# Patient Record
Sex: Female | Born: 1965 | Hispanic: Yes | Marital: Single | Smoking: Never smoker
Health system: Southern US, Community
[De-identification: ages and names within clinical notes are randomized; demographics above are authoritative.]

## PROBLEM LIST (undated history)

## (undated) DIAGNOSIS — F329 Major depressive disorder, single episode, unspecified: Secondary | ICD-10-CM

## (undated) DIAGNOSIS — F32A Depression, unspecified: Secondary | ICD-10-CM

## (undated) HISTORY — DX: Major depressive disorder, single episode, unspecified: F32.9

## (undated) HISTORY — DX: Depression, unspecified: F32.A

## (undated) HISTORY — PX: TUBAL LIGATION: SHX77

---

## 2012-09-28 ENCOUNTER — Ambulatory Visit: Payer: Self-pay | Admitting: Family Medicine

## 2013-09-20 ENCOUNTER — Emergency Department: Payer: Self-pay | Admitting: Emergency Medicine

## 2015-01-31 IMAGING — CT CT HEAD WITHOUT CONTRAST
1 series · 16 of 30 positions shown, 20 images · non-contrast
Comparison: none

REASON FOR EXAM: 3 days of increasing HA with vertigo
COMMENTS:   LMP: Post-Menopausal

[Series 2: soft tissue · axial · 0.42mm/px · z∈[+332,+468]mm · 16 of 31 slices shown, 20 images]
[im 2/31  brain]
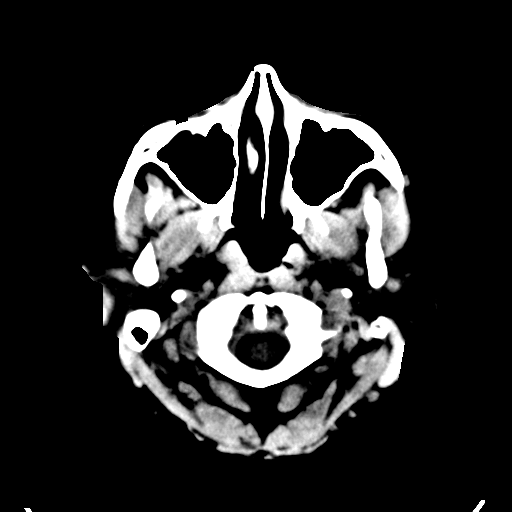
[im 2/31  bone]
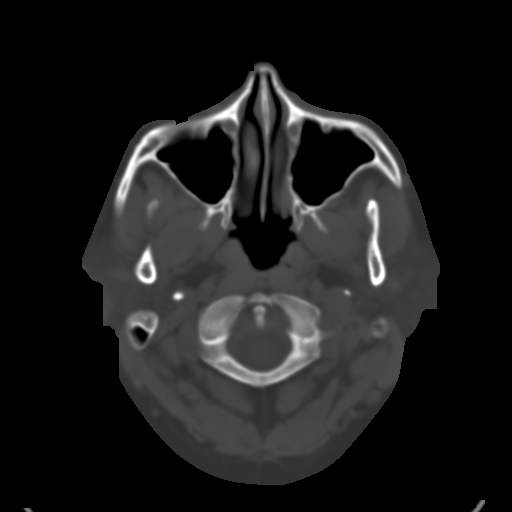
[im 4/31  brain]
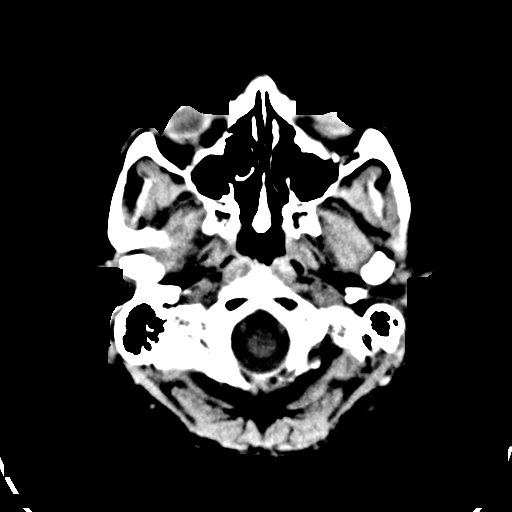
[im 6/31  brain]
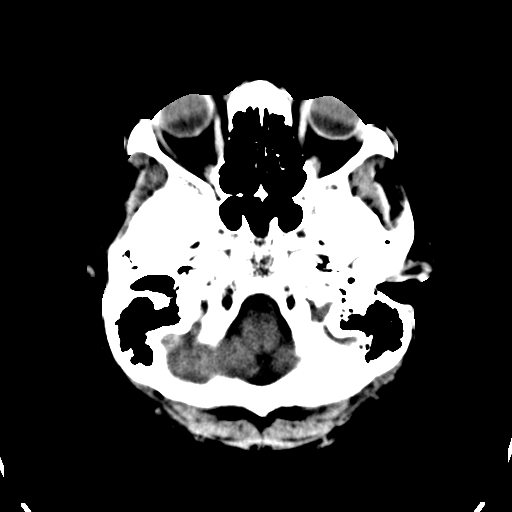
[im 8/31  brain]
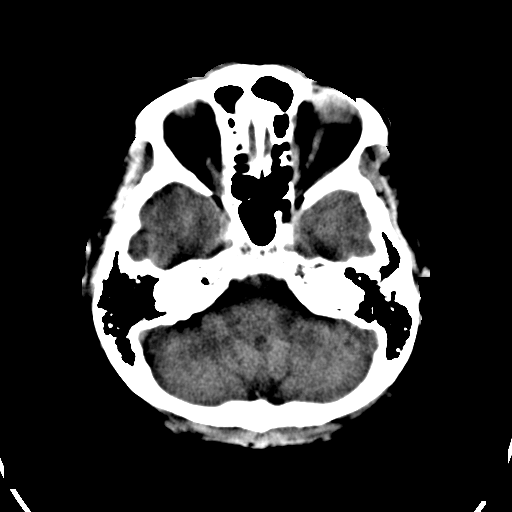
[im 9/31  brain]
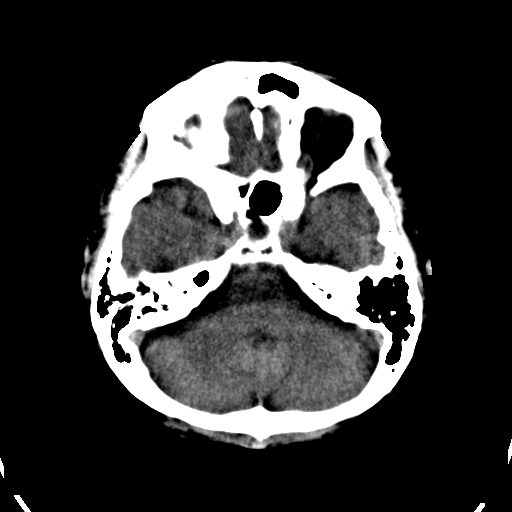
[im 9/31  bone]
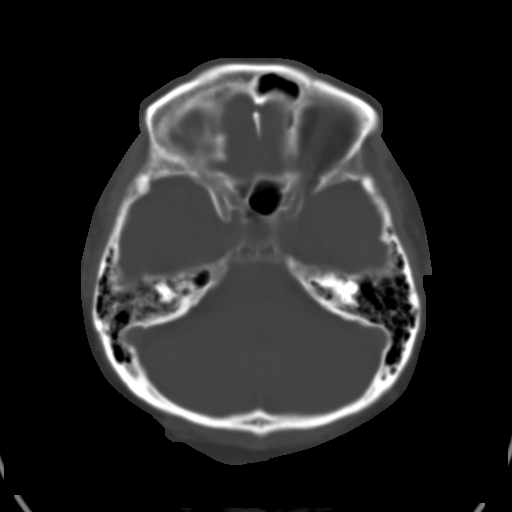
[im 11/31  brain]
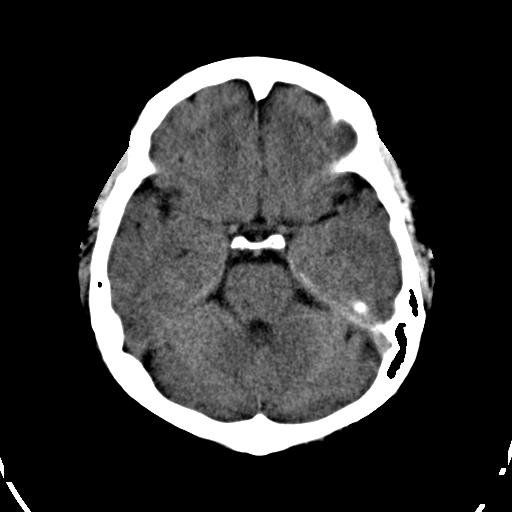
[im 13/31  brain]
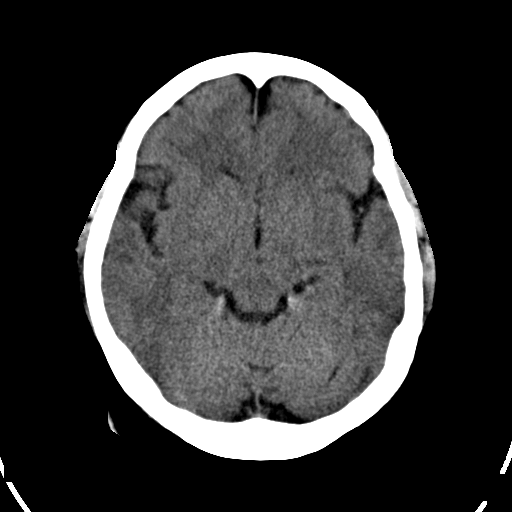
[im 15/31  brain]
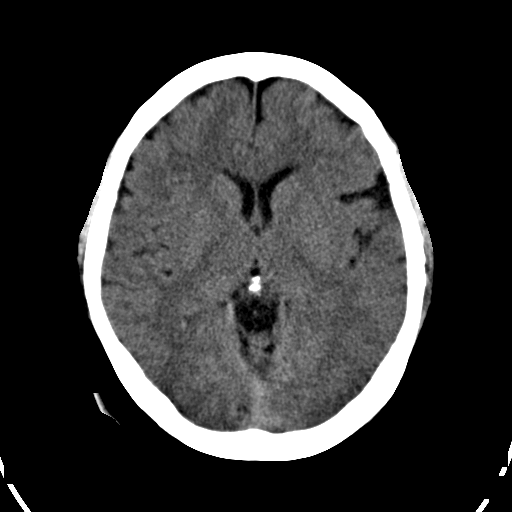
[im 16/31  brain]
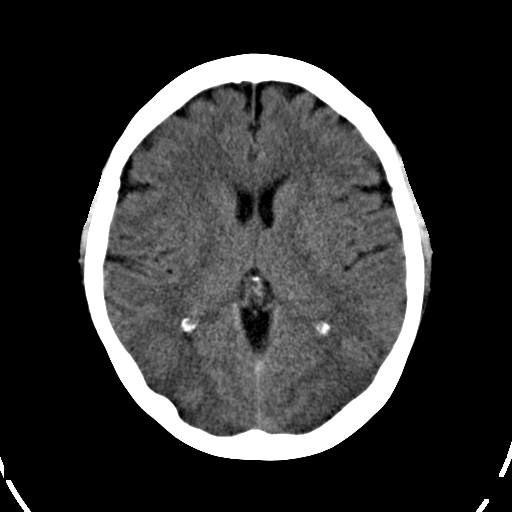
[im 16/31  bone]
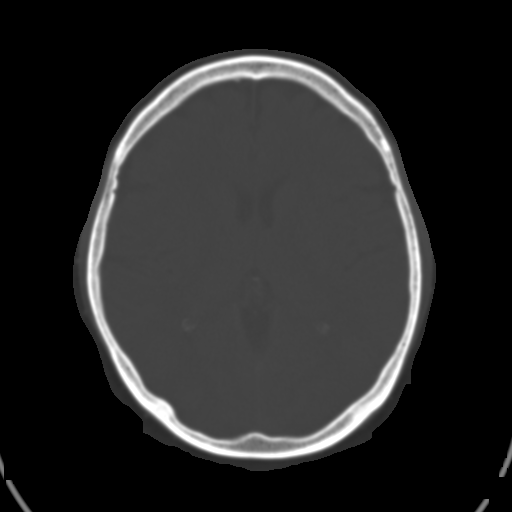
[im 18/31  brain]
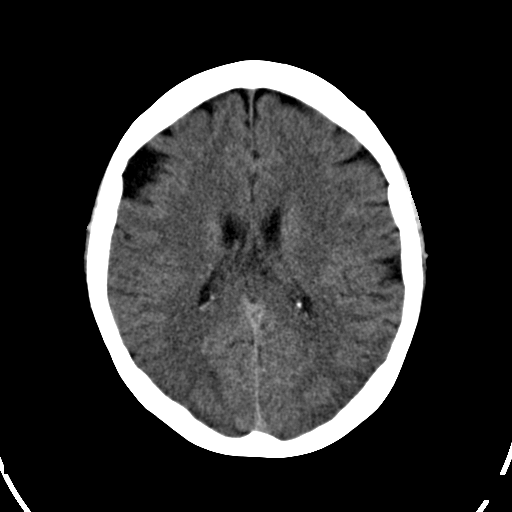
[im 20/31  brain]
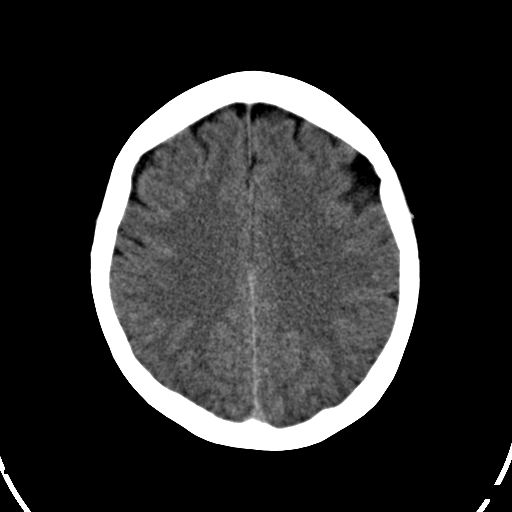
[im 22/31  brain]
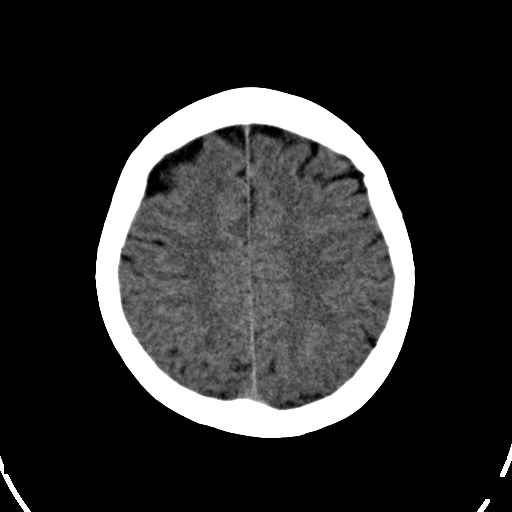
[im 23/31  brain]
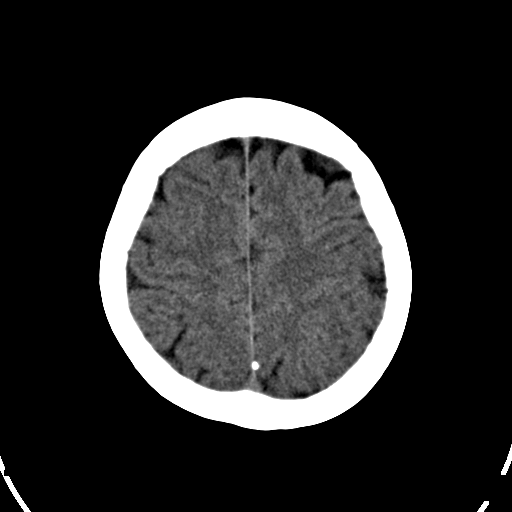
[im 23/31  bone]
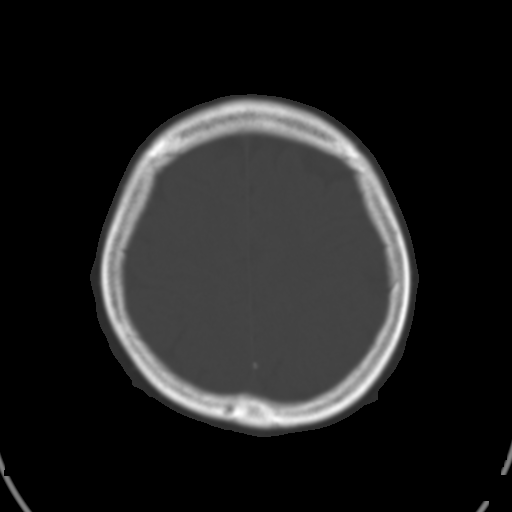
[im 25/31  brain]
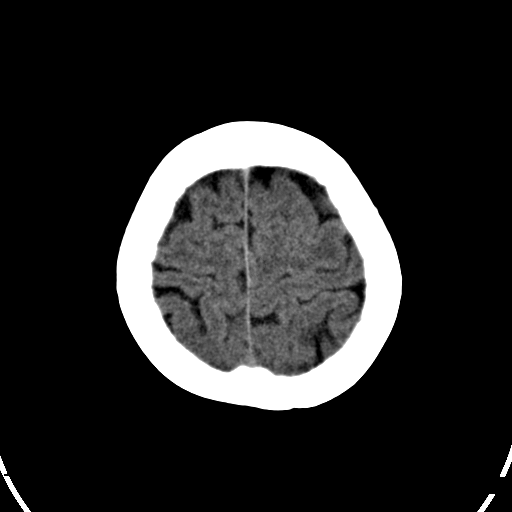
[im 27/31  brain]
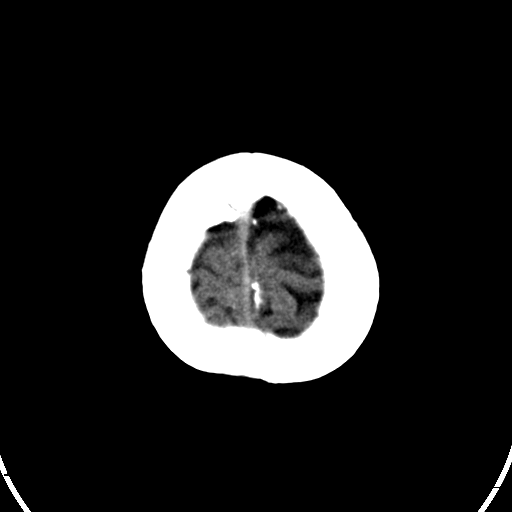
[im 29/31  brain]
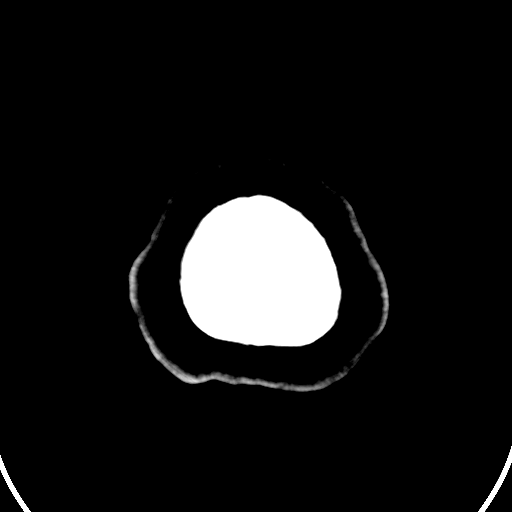

[16 of 30 positions shown; findings below may reference images not displayed]

PROCEDURE:     CT  - CT HEAD WITHOUT CONTRAST  - September 20, 2013  [DATE]

RESULT:     Axial noncontrast CT scanning was performed through the brain
with reconstructions at 5 mm intervals and slice thicknesses.

The ventricles are normal in size and position. There is no intracranial
hemorrhage nor intracranial mass effect. There is no evidence of an evolving
ischemic infarction. The cerebellum and brainstem are normal in density. At
bone window settings the observed portions of the paranasal sinuses and
mastoid air cells are clear. There is no evidence of an acute skull fracture.
IMPRESSION: 1. There is no evidence of an acute ischemic or hemorrhagic infarction.
2. There is no intracranial mass effect nor hydrocephalus. Specific
attention to the cerebellum and brainstem exhibits no acute abnormality.
3. There is no evidence of acute acute inflammation the visualized portions
of the paranasal sinuses or mastoid air cells.

[REDACTED]

## 2015-06-16 ENCOUNTER — Ambulatory Visit
Admission: RE | Admit: 2015-06-16 | Discharge: 2015-06-16 | Disposition: A | Discharge status: Home / Self Care | Payer: BLUE CROSS/BLUE SHIELD | Source: Ambulatory Visit | Attending: Gastroenterology | Admitting: Gastroenterology

## 2015-06-16 ENCOUNTER — Ambulatory Visit: Payer: BLUE CROSS/BLUE SHIELD | Admitting: Anesthesiology

## 2015-06-16 ENCOUNTER — Encounter
Admission: RE | Disposition: A | Discharge status: Home / Self Care | Payer: Self-pay | Source: Ambulatory Visit | Attending: Gastroenterology

## 2015-06-16 ENCOUNTER — Encounter: Payer: Self-pay | Admitting: *Deleted

## 2015-06-16 DIAGNOSIS — Z1211 Encounter for screening for malignant neoplasm of colon: Secondary | ICD-10-CM | POA: Insufficient documentation

## 2015-06-16 DIAGNOSIS — Z791 Long term (current) use of non-steroidal anti-inflammatories (NSAID): Secondary | ICD-10-CM | POA: Insufficient documentation

## 2015-06-16 DIAGNOSIS — Z79899 Other long term (current) drug therapy: Secondary | ICD-10-CM | POA: Insufficient documentation

## 2015-06-16 DIAGNOSIS — I252 Old myocardial infarction: Secondary | ICD-10-CM | POA: Diagnosis not present

## 2015-06-16 DIAGNOSIS — K648 Other hemorrhoids: Secondary | ICD-10-CM | POA: Insufficient documentation

## 2015-06-16 HISTORY — PX: COLONOSCOPY WITH PROPOFOL: SHX5780

## 2015-06-16 SURGERY — COLONOSCOPY WITH PROPOFOL
Anesthesia: General

## 2015-06-16 MED ORDER — PHENYLEPHRINE HCL 10 MG/ML IJ SOLN
INTRAMUSCULAR | Status: DC | PRN
  Administered 2015-06-16: 100 ug via INTRAVENOUS

## 2015-06-16 MED ORDER — SODIUM CHLORIDE 0.9 % IV SOLN
INTRAVENOUS | Status: DC
  Administered 2015-06-16: 15:00:00 via INTRAVENOUS

## 2015-06-16 MED ORDER — PROPOFOL INFUSION 10 MG/ML OPTIME
INTRAVENOUS | Status: DC | PRN
  Administered 2015-06-16: 180 ug/kg/min via INTRAVENOUS

## 2015-06-16 MED ORDER — LIDOCAINE HCL (CARDIAC) 20 MG/ML IV SOLN
INTRAVENOUS | Status: DC | PRN
  Administered 2015-06-16: 100 mg via INTRAVENOUS

## 2015-06-16 MED ORDER — MIDAZOLAM HCL 2 MG/2ML IJ SOLN
INTRAMUSCULAR | Status: DC | PRN
  Administered 2015-06-16: 1 mg via INTRAVENOUS

## 2015-06-16 MED ORDER — PROPOFOL 10 MG/ML IV BOLUS
INTRAVENOUS | Status: DC | PRN
  Administered 2015-06-16: 60 mg via INTRAVENOUS
  Administered 2015-06-16: 40 mg via INTRAVENOUS

## 2015-06-16 NOTE — Op Note (Signed)
[facility] Gastroenterology Patient Name: Diana HongSusana Gonzalez Cobos Procedure Date: 06/16/2015 3:54 PM MRN: [id number] Account #: [id number] Date of Birth: 23-Jun-1966 Admit Type: Outpatient Age: 89 [facility] Gender: Female Note Status: Finalized Procedure:         Colonoscopy Indications:       Screening for colorectal malignant neoplasm, This is the                     patient's first colonoscopy Providers:         Christena DeemMartin U. Skulskie, MD Medicines:         Monitored Anesthesia Care Complications:     No immediate complications. Procedure:         Pre-Anesthesia Assessment:                    - ASA Grade Assessment: I - A normal, healthy patient.                    After obtaining informed consent, the colonoscope was                     passed under direct vision. Throughout the procedure, the                     patient's blood pressure, pulse, and oxygen saturations                     were monitored continuously. The Colonoscope was                     introduced through the anus and advanced to the the cecum,                     identified by appendiceal orifice and ileocecal valve. The                     colonoscopy was performed without difficulty. The patient                     tolerated the procedure well. The quality of the bowel                     preparation was good. Findings:      Non-bleeding internal hemorrhoids were found during retroflexion. The       hemorrhoids were small.      The exam was otherwise without abnormality on direct and retroflexion       views. Impression:        - Non-bleeding internal hemorrhoids.                    - The examination was otherwise normal on direct and                     retroflexion views.                    - No specimens collected. Recommendation:    - Repeat colonoscopy in 10 years for screening purposes. Procedure Code(s): --- Professional ---                    [id number], Colonoscopy,  flexible; diagnostic, including                     collection of specimen(s)  by brushing or washing, when                     performed (separate procedure) Diagnosis Code(s): --- Professional ---                    V76.51, Special screening for malignant neoplasms of colon                    455.0, Internal hemorrhoids without mention of complication CPT copyright 2014 American Medical Association. All rights reserved. The codes documented in this report are preliminary and upon coder review may  be revised to meet current compliance requirements. Christena Deem, MD 06/16/2015 4:31:29 PM This report has been signed electronically. Number of Addenda: 0 Note Initiated On: 06/16/2015 3:54 PM Scope Withdrawal Time: 0 hours 9 minutes 14 seconds  Total Procedure Duration: 0 hours 17 minutes 7 seconds       [facility]

## 2015-06-16 NOTE — H&P (Signed)
Outpatient short stay form Pre-procedure 06/16/2015 3:12 PM Diana DeemMartin U Skulskie MD  Primary Physician: Dr Brigid ReSusan Gonzales-Cobos  Reason for visit:  Screening colonoscopy  History of present illness:  Patient is a 49 year old female sending for screening colonoscopy. She's never had a colonoscopy before. She is unaware of any family history of colon polyps or colon cancer. He is the first person her family have a colonoscopy. Tolerated her prep well. No aspirin or anticoagulates medications.    Current facility-administered medications:  .  0.9 %  sodium chloride infusion, , Intravenous, Continuous, Diana DeemMartin U Skulskie, MD  Prescriptions prior to admission  Medication Sig Dispense Refill Last Dose  . acetaminophen (TYLENOL) 500 MG tablet Take 1,000 mg by mouth 2 (two) times daily.   Past Month at Unknown time  . naproxen (NAPROSYN) 500 MG tablet Take 500 mg by mouth 2 (two) times daily with a meal.   Completed Course at Unknown time     No Known Allergies   History reviewed. No pertinent past medical history.  Review of systems:      Physical Exam    Heart and lungs: Bilaterally clear to auscultation, rate and rhythm without rub murmur or gallop.    HEENT: Normocephalic atraumatic eyes are anicteric    Other:     Pertinant exam for procedure: Soft nontender nondistended bowel sounds are positive and normoactive    Planned proceedures: Colonoscopy and indicated procedures I have discussed the risks benefits and complications of procedures to include not limited to bleeding, infection, perforation and the risk of sedation and the patient wishes to proceed.    Diana DeemMartin U Skulskie, MD Gastroenterology 06/16/2015  3:12 PM

## 2015-06-16 NOTE — Anesthesia Postprocedure Evaluation (Signed)
  Anesthesia Post-op Note  Patient: Diana Kaufman  Procedure(s) Performed: Procedure(s): COLONOSCOPY WITH PROPOFOL (N/A)  Anesthesia type:General  Patient location: PACU  Post pain: Pain level controlled  Post assessment: Post-op Vital signs reviewed, Patient's Cardiovascular Status Stable, Respiratory Function Stable, Patent Airway and No signs of Nausea or vomiting  Post vital signs: Reviewed and stable  Last Vitals:  Filed Vitals:   06/16/15 1643  BP: 95/56  Pulse: 70  Temp:   Resp: 14    Level of consciousness: awake, alert  and patient cooperative  Complications: No apparent anesthesia complications
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
## 2015-06-16 NOTE — Transfer of Care (Signed)
Immediate Anesthesia Transfer of Care Note  Patient: Diana Kaufman  Procedure(s) Performed: Procedure(s): COLONOSCOPY WITH PROPOFOL (N/A)  Patient Location: PACU  Anesthesia Type:General  Level of Consciousness: awake  Airway & Oxygen Therapy: Patient Spontanous Breathing and Patient connected to nasal cannula oxygen  Post-op Assessment: Report given to RN and Post -op Vital signs reviewed and stable  Post vital signs: stable  Last Vitals:  Filed Vitals:   06/16/15 1634  BP: 95/57  Pulse: 75  Temp: 36 C  Resp: 15    Complications: No apparent anesthesia complications

## 2015-06-19 ENCOUNTER — Encounter: Payer: Self-pay | Admitting: Gastroenterology

## 2015-06-19 NOTE — Addendum Note (Signed)
Addendum  created 06/19/15 1143 by Yves DillPaul Marcas Bowsher, MD   Modules edited: Clinical Notes   Clinical Notes:  File: [id number]

## 2017-02-18 ENCOUNTER — Ambulatory Visit
Admission: RE | Admit: 2017-02-18 | Discharge: 2017-02-18 | Disposition: A | Discharge status: Home / Self Care | Payer: BLUE CROSS/BLUE SHIELD | Source: Ambulatory Visit | Attending: Physician Assistant | Admitting: Physician Assistant

## 2017-02-18 ENCOUNTER — Other Ambulatory Visit: Payer: Self-pay | Admitting: Physician Assistant

## 2017-02-18 DIAGNOSIS — M25531 Pain in right wrist: Secondary | ICD-10-CM | POA: Diagnosis present

## 2017-02-18 DIAGNOSIS — R52 Pain, unspecified: Secondary | ICD-10-CM

## 2017-06-03 ENCOUNTER — Ambulatory Visit: Payer: BLUE CROSS/BLUE SHIELD | Attending: Physician Assistant

## 2017-06-03 DIAGNOSIS — M79601 Pain in right arm: Secondary | ICD-10-CM | POA: Insufficient documentation

## 2017-06-03 DIAGNOSIS — M79631 Pain in right forearm: Secondary | ICD-10-CM

## 2017-06-03 DIAGNOSIS — M542 Cervicalgia: Secondary | ICD-10-CM | POA: Insufficient documentation

## 2017-06-03 DIAGNOSIS — M25511 Pain in right shoulder: Secondary | ICD-10-CM | POA: Insufficient documentation

## 2017-06-03 DIAGNOSIS — M6281 Muscle weakness (generalized): Secondary | ICD-10-CM | POA: Diagnosis present

## 2017-06-03 DIAGNOSIS — G8929 Other chronic pain: Secondary | ICD-10-CM | POA: Diagnosis present
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
## 2017-06-09 ENCOUNTER — Ambulatory Visit: Payer: BLUE CROSS/BLUE SHIELD

## 2017-06-12 ENCOUNTER — Ambulatory Visit: Payer: BLUE CROSS/BLUE SHIELD | Attending: Physician Assistant

## 2017-06-12 DIAGNOSIS — G8929 Other chronic pain: Secondary | ICD-10-CM | POA: Diagnosis present

## 2017-06-12 DIAGNOSIS — M542 Cervicalgia: Secondary | ICD-10-CM

## 2017-06-12 DIAGNOSIS — M79601 Pain in right arm: Secondary | ICD-10-CM | POA: Diagnosis present

## 2017-06-12 DIAGNOSIS — M79631 Pain in right forearm: Secondary | ICD-10-CM | POA: Diagnosis present

## 2017-06-12 DIAGNOSIS — M25511 Pain in right shoulder: Secondary | ICD-10-CM | POA: Insufficient documentation

## 2017-06-12 DIAGNOSIS — M6281 Muscle weakness (generalized): Secondary | ICD-10-CM | POA: Diagnosis present
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
## 2017-06-12 NOTE — Patient Instructions (Signed)
Scapular Retraction (Standing)    Con brazos a los lados del cuerpo, Abbott Laboratoriesuna los omplatos.  Mantenga por 5 segundos. Repita _10___ veces por rutina. Realice __3__ Carlis Stablerutinas por sesin. Realice _1___ sesiones por da.  http://orth.exer.us/945   Copyright  VHI. All rights reserved.

## 2017-06-16 ENCOUNTER — Ambulatory Visit: Payer: BLUE CROSS/BLUE SHIELD

## 2017-06-16 ENCOUNTER — Telehealth: Payer: Self-pay

## 2017-06-16 NOTE — Telephone Encounter (Signed)
No show. Interpreter called pt. Pt states that today is the one month anniversary of the death of her son and she forgot. She'll be able to make it to her next session. R UE hurts down to the arm pit area. Has numbness from her elbow to her wrist.

## 2017-06-19 ENCOUNTER — Ambulatory Visit: Payer: BLUE CROSS/BLUE SHIELD

## 2017-06-19 DIAGNOSIS — M79631 Pain in right forearm: Secondary | ICD-10-CM

## 2017-06-19 DIAGNOSIS — M25511 Pain in right shoulder: Secondary | ICD-10-CM | POA: Diagnosis not present

## 2017-06-19 DIAGNOSIS — M6281 Muscle weakness (generalized): Secondary | ICD-10-CM

## 2017-06-19 DIAGNOSIS — M79601 Pain in right arm: Secondary | ICD-10-CM

## 2017-06-19 DIAGNOSIS — M542 Cervicalgia: Secondary | ICD-10-CM

## 2017-06-19 DIAGNOSIS — G8929 Other chronic pain: Secondary | ICD-10-CM
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
## 2017-06-24 ENCOUNTER — Ambulatory Visit: Payer: BLUE CROSS/BLUE SHIELD

## 2017-06-24 DIAGNOSIS — M25511 Pain in right shoulder: Principal | ICD-10-CM

## 2017-06-24 DIAGNOSIS — M6281 Muscle weakness (generalized): Secondary | ICD-10-CM

## 2017-06-24 DIAGNOSIS — M79631 Pain in right forearm: Secondary | ICD-10-CM

## 2017-06-24 DIAGNOSIS — G8929 Other chronic pain: Secondary | ICD-10-CM

## 2017-06-24 DIAGNOSIS — M79601 Pain in right arm: Secondary | ICD-10-CM

## 2017-06-24 DIAGNOSIS — M542 Cervicalgia: Secondary | ICD-10-CM
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
## 2017-06-26 ENCOUNTER — Ambulatory Visit: Payer: BLUE CROSS/BLUE SHIELD

## 2017-06-26 DIAGNOSIS — M79601 Pain in right arm: Secondary | ICD-10-CM

## 2017-06-26 DIAGNOSIS — M25511 Pain in right shoulder: Principal | ICD-10-CM

## 2017-06-26 DIAGNOSIS — M79631 Pain in right forearm: Secondary | ICD-10-CM

## 2017-06-26 DIAGNOSIS — G8929 Other chronic pain: Secondary | ICD-10-CM

## 2017-06-26 DIAGNOSIS — M6281 Muscle weakness (generalized): Secondary | ICD-10-CM
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
## 2017-07-01 ENCOUNTER — Ambulatory Visit: Payer: BLUE CROSS/BLUE SHIELD

## 2017-07-01 DIAGNOSIS — M25511 Pain in right shoulder: Secondary | ICD-10-CM

## 2017-07-01 DIAGNOSIS — M79601 Pain in right arm: Secondary | ICD-10-CM

## 2017-07-01 DIAGNOSIS — M79631 Pain in right forearm: Secondary | ICD-10-CM

## 2017-07-01 DIAGNOSIS — M542 Cervicalgia: Secondary | ICD-10-CM

## 2017-07-01 DIAGNOSIS — M6281 Muscle weakness (generalized): Secondary | ICD-10-CM

## 2017-07-01 DIAGNOSIS — G8929 Other chronic pain: Secondary | ICD-10-CM

## 2017-07-01 NOTE — Patient Instructions (Signed)
Pt was recommended to move her R forearm and open and close her fist throughout the day comfortably to see if it helps her with her R forearm numbness. Pt demonstrated and verbalized understanding. Interpreter present.
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
## 2017-07-03 ENCOUNTER — Ambulatory Visit: Payer: BLUE CROSS/BLUE SHIELD

## 2017-07-03 DIAGNOSIS — M79631 Pain in right forearm: Secondary | ICD-10-CM

## 2017-07-03 DIAGNOSIS — M25511 Pain in right shoulder: Secondary | ICD-10-CM

## 2017-07-03 DIAGNOSIS — M79601 Pain in right arm: Secondary | ICD-10-CM

## 2017-07-03 DIAGNOSIS — M6281 Muscle weakness (generalized): Secondary | ICD-10-CM

## 2017-07-03 DIAGNOSIS — G8929 Other chronic pain: Secondary | ICD-10-CM
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
## 2017-07-08 ENCOUNTER — Ambulatory Visit: Payer: BLUE CROSS/BLUE SHIELD

## 2017-07-08 DIAGNOSIS — M25511 Pain in right shoulder: Secondary | ICD-10-CM | POA: Diagnosis not present

## 2017-07-08 DIAGNOSIS — M79631 Pain in right forearm: Secondary | ICD-10-CM

## 2017-07-08 DIAGNOSIS — M542 Cervicalgia: Secondary | ICD-10-CM

## 2017-07-08 DIAGNOSIS — M79601 Pain in right arm: Secondary | ICD-10-CM

## 2017-07-08 DIAGNOSIS — M6281 Muscle weakness (generalized): Secondary | ICD-10-CM

## 2017-07-08 DIAGNOSIS — G8929 Other chronic pain: Secondary | ICD-10-CM
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
## 2017-07-15 ENCOUNTER — Ambulatory Visit: Payer: BLUE CROSS/BLUE SHIELD | Attending: Physician Assistant

## 2017-07-15 DIAGNOSIS — M542 Cervicalgia: Secondary | ICD-10-CM | POA: Diagnosis present

## 2017-07-15 DIAGNOSIS — G8929 Other chronic pain: Secondary | ICD-10-CM | POA: Insufficient documentation

## 2017-07-15 DIAGNOSIS — M79601 Pain in right arm: Secondary | ICD-10-CM

## 2017-07-15 DIAGNOSIS — M25511 Pain in right shoulder: Secondary | ICD-10-CM | POA: Insufficient documentation

## 2017-07-15 DIAGNOSIS — M6281 Muscle weakness (generalized): Secondary | ICD-10-CM

## 2017-07-15 DIAGNOSIS — M79631 Pain in right forearm: Secondary | ICD-10-CM | POA: Diagnosis present
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
## 2017-07-22 ENCOUNTER — Ambulatory Visit: Payer: BLUE CROSS/BLUE SHIELD

## 2017-07-22 DIAGNOSIS — M6281 Muscle weakness (generalized): Secondary | ICD-10-CM

## 2017-07-22 DIAGNOSIS — G8929 Other chronic pain: Secondary | ICD-10-CM

## 2017-07-22 DIAGNOSIS — M79601 Pain in right arm: Secondary | ICD-10-CM

## 2017-07-22 DIAGNOSIS — M79631 Pain in right forearm: Secondary | ICD-10-CM

## 2017-07-22 DIAGNOSIS — M542 Cervicalgia: Secondary | ICD-10-CM

## 2017-07-22 DIAGNOSIS — M25511 Pain in right shoulder: Secondary | ICD-10-CM
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
## 2017-07-24 ENCOUNTER — Ambulatory Visit: Payer: BLUE CROSS/BLUE SHIELD

## 2017-07-24 DIAGNOSIS — M25511 Pain in right shoulder: Secondary | ICD-10-CM | POA: Diagnosis not present

## 2017-07-24 DIAGNOSIS — M6281 Muscle weakness (generalized): Secondary | ICD-10-CM

## 2017-07-24 DIAGNOSIS — G8929 Other chronic pain: Secondary | ICD-10-CM

## 2017-07-24 DIAGNOSIS — M79601 Pain in right arm: Secondary | ICD-10-CM

## 2017-07-24 DIAGNOSIS — M79631 Pain in right forearm: Secondary | ICD-10-CM

## 2017-07-24 DIAGNOSIS — M542 Cervicalgia: Secondary | ICD-10-CM

## 2017-07-24 NOTE — Patient Instructions (Signed)
Resisted External Rotation: in Neutral - Bilateral    Sentado o de pie, agarre la banda elstica con ambas manos, codos a los lados doblados a 90, antebrazos hacia adelante. Una los omplatos y rote los antebrazos hacia fuera. Mantenga los codos pegados al cuerpo. Repita __10__ veces por rutina. Realice _3___ Carlis Stablerutinas por sesin. Realice _1___ sesiones por da.  http://orth.exer.us/967   Copyright  VHI. All rights reserved.     Bicep curls   Standing or sitting,    Flex your forearm to bend at your elbow.   Repeat 10 times,    Perform 3 sets per day.     Use 2 lb weight.   Curl de Biceps              Parada o sentada,             Flexione el antebrazo para doblar el codo.             Repita  Veces,             Haga 10 series por dia.             Use pesas de 2 libras.
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
## 2017-07-28 ENCOUNTER — Ambulatory Visit: Payer: BLUE CROSS/BLUE SHIELD

## 2017-07-29 ENCOUNTER — Ambulatory Visit: Payer: BLUE CROSS/BLUE SHIELD

## 2017-07-29 DIAGNOSIS — M25511 Pain in right shoulder: Secondary | ICD-10-CM | POA: Diagnosis not present

## 2017-07-29 DIAGNOSIS — M79601 Pain in right arm: Secondary | ICD-10-CM

## 2017-07-29 DIAGNOSIS — M542 Cervicalgia: Secondary | ICD-10-CM

## 2017-07-29 DIAGNOSIS — M79631 Pain in right forearm: Secondary | ICD-10-CM

## 2017-07-29 DIAGNOSIS — G8929 Other chronic pain: Secondary | ICD-10-CM

## 2017-07-29 DIAGNOSIS — M6281 Muscle weakness (generalized): Secondary | ICD-10-CM
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
## 2017-07-31 ENCOUNTER — Ambulatory Visit: Payer: BLUE CROSS/BLUE SHIELD

## 2017-07-31 DIAGNOSIS — M25511 Pain in right shoulder: Secondary | ICD-10-CM | POA: Diagnosis not present

## 2017-07-31 DIAGNOSIS — M6281 Muscle weakness (generalized): Secondary | ICD-10-CM

## 2017-07-31 DIAGNOSIS — M79631 Pain in right forearm: Secondary | ICD-10-CM

## 2017-07-31 DIAGNOSIS — G8929 Other chronic pain: Secondary | ICD-10-CM

## 2017-07-31 DIAGNOSIS — M542 Cervicalgia: Secondary | ICD-10-CM

## 2017-07-31 DIAGNOSIS — M79601 Pain in right arm: Secondary | ICD-10-CM
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
## 2017-08-04 ENCOUNTER — Ambulatory Visit: Payer: BLUE CROSS/BLUE SHIELD

## 2017-08-04 DIAGNOSIS — M6281 Muscle weakness (generalized): Secondary | ICD-10-CM

## 2017-08-04 DIAGNOSIS — M542 Cervicalgia: Secondary | ICD-10-CM

## 2017-08-04 DIAGNOSIS — M25511 Pain in right shoulder: Secondary | ICD-10-CM | POA: Diagnosis not present

## 2017-08-04 DIAGNOSIS — G8929 Other chronic pain: Secondary | ICD-10-CM

## 2017-08-04 DIAGNOSIS — M79601 Pain in right arm: Secondary | ICD-10-CM

## 2017-08-04 DIAGNOSIS — M79631 Pain in right forearm: Secondary | ICD-10-CM
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
## 2017-08-07 ENCOUNTER — Ambulatory Visit: Payer: BLUE CROSS/BLUE SHIELD

## 2017-08-13 ENCOUNTER — Ambulatory Visit: Payer: BLUE CROSS/BLUE SHIELD | Attending: Physician Assistant

## 2017-08-13 DIAGNOSIS — M542 Cervicalgia: Secondary | ICD-10-CM | POA: Diagnosis present

## 2017-08-13 DIAGNOSIS — M79631 Pain in right forearm: Secondary | ICD-10-CM | POA: Insufficient documentation

## 2017-08-13 DIAGNOSIS — M6281 Muscle weakness (generalized): Secondary | ICD-10-CM | POA: Diagnosis present

## 2017-08-13 DIAGNOSIS — M79601 Pain in right arm: Secondary | ICD-10-CM | POA: Diagnosis present

## 2017-08-13 DIAGNOSIS — G8929 Other chronic pain: Secondary | ICD-10-CM | POA: Insufficient documentation

## 2017-08-13 DIAGNOSIS — M25511 Pain in right shoulder: Secondary | ICD-10-CM | POA: Insufficient documentation
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
## 2017-08-18 ENCOUNTER — Ambulatory Visit: Payer: BLUE CROSS/BLUE SHIELD

## 2017-08-18 ENCOUNTER — Telehealth: Payer: Self-pay

## 2017-08-18 NOTE — Telephone Encounter (Signed)
No show, interpreter Myriam Jacobson(Helen) called and left a message pertaining to her appointment and a reminder for her next follow up session.

## 2017-08-20 ENCOUNTER — Ambulatory Visit: Payer: BLUE CROSS/BLUE SHIELD

## 2017-08-20 DIAGNOSIS — M25511 Pain in right shoulder: Secondary | ICD-10-CM | POA: Diagnosis not present

## 2017-08-20 DIAGNOSIS — M542 Cervicalgia: Secondary | ICD-10-CM

## 2017-08-20 DIAGNOSIS — M79631 Pain in right forearm: Secondary | ICD-10-CM

## 2017-08-20 DIAGNOSIS — G8929 Other chronic pain: Secondary | ICD-10-CM

## 2017-08-20 DIAGNOSIS — M79601 Pain in right arm: Secondary | ICD-10-CM

## 2017-08-20 DIAGNOSIS — M6281 Muscle weakness (generalized): Secondary | ICD-10-CM

## 2017-08-20 NOTE — Patient Instructions (Signed)
Gave T-band (red) R shoulder IR 10x2 with 10 second holds daily and R shoulder IR behind back 10x3 daily as part of her HEP. Pt demonstrated and verbalized understanding. Interpreter present.
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
## 2017-08-25 ENCOUNTER — Ambulatory Visit: Payer: BLUE CROSS/BLUE SHIELD

## 2017-08-25 DIAGNOSIS — M25511 Pain in right shoulder: Secondary | ICD-10-CM | POA: Diagnosis not present

## 2017-08-25 DIAGNOSIS — M6281 Muscle weakness (generalized): Secondary | ICD-10-CM

## 2017-08-25 DIAGNOSIS — G8929 Other chronic pain: Secondary | ICD-10-CM

## 2017-08-25 DIAGNOSIS — M542 Cervicalgia: Secondary | ICD-10-CM

## 2017-08-25 DIAGNOSIS — M79631 Pain in right forearm: Secondary | ICD-10-CM

## 2017-08-25 DIAGNOSIS — M79601 Pain in right arm: Secondary | ICD-10-CM
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
## 2017-08-28 ENCOUNTER — Ambulatory Visit: Payer: BLUE CROSS/BLUE SHIELD

## 2017-08-28 DIAGNOSIS — M25511 Pain in right shoulder: Principal | ICD-10-CM

## 2017-08-28 DIAGNOSIS — M542 Cervicalgia: Secondary | ICD-10-CM

## 2017-08-28 DIAGNOSIS — M6281 Muscle weakness (generalized): Secondary | ICD-10-CM

## 2017-08-28 DIAGNOSIS — G8929 Other chronic pain: Secondary | ICD-10-CM

## 2017-08-28 DIAGNOSIS — M79631 Pain in right forearm: Secondary | ICD-10-CM

## 2017-08-28 DIAGNOSIS — M79601 Pain in right arm: Secondary | ICD-10-CM
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
## 2017-09-01 ENCOUNTER — Ambulatory Visit: Payer: BLUE CROSS/BLUE SHIELD

## 2017-09-04 ENCOUNTER — Ambulatory Visit: Payer: BLUE CROSS/BLUE SHIELD

## 2017-11-26 ENCOUNTER — Other Ambulatory Visit: Payer: Self-pay | Admitting: Physician Assistant

## 2017-11-26 DIAGNOSIS — S63591D Other specified sprain of right wrist, subsequent encounter: Secondary | ICD-10-CM

## 2017-12-03 ENCOUNTER — Ambulatory Visit
Admission: RE | Admit: 2017-12-03 | Discharge: 2017-12-03 | Disposition: A | Discharge status: Home / Self Care | Payer: BLUE CROSS/BLUE SHIELD | Source: Ambulatory Visit | Attending: Physician Assistant | Admitting: Physician Assistant

## 2017-12-03 DIAGNOSIS — S63591D Other specified sprain of right wrist, subsequent encounter: Secondary | ICD-10-CM
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
